# Patient Record
Sex: Female | Born: 1935 | Race: White | Hispanic: No
Health system: Midwestern US, Community
[De-identification: ages and names within clinical notes are randomized; demographics above are authoritative.]

## PROBLEM LIST (undated history)

## (undated) DIAGNOSIS — N3090 Cystitis, unspecified without hematuria: Secondary | ICD-10-CM

---

## 2018-07-10 ENCOUNTER — Ambulatory Visit: Attending: Family | Primary: Family

## 2018-07-10 ENCOUNTER — Ambulatory Visit: Admit: 2018-07-10 | Discharge: 2018-07-10 | Payer: MEDICARE | Attending: Family | Primary: Family

## 2018-07-10 DIAGNOSIS — E039 Hypothyroidism, unspecified: Secondary | ICD-10-CM

## 2018-07-10 NOTE — Progress Notes (Signed)
HISTORY OF PRESENT ILLNESS  Rebekah Snyder is a 84 y.o. female. New patient here to get established for care. Here with caregiver and her daughter in law.   Has advanced dementia Her husband who was her caregiver died 2 months ago. She has been brought to [place] to live with her son and his wife. They are paying for sitter during the day while they work.   Has had deterioration in her behavior . In the past has had a UTI with these changes. Is sleeping more . Has more irritability and is less cooperative. Is incontinent wears briefs. Will not allow anyone to help her bath and her hygiene has deteriorated.   Not able to cooperate with getting a urine specimen.   Is eating has a good appetite but does not drink very much.   Daughter in law wishes for her to have a flu shot    HPI    Review of Systems   Constitutional: Positive for malaise/fatigue. Negative for chills and fever.   HENT: Negative for congestion.    Respiratory: Negative for cough and shortness of breath.    Cardiovascular: Negative for chest pain and leg swelling.   Gastrointestinal: Negative for abdominal pain, constipation and diarrhea.   Genitourinary: Positive for frequency.        Incontinence   Musculoskeletal: Negative for back pain and myalgias.   Skin: Negative for rash.   Neurological: Negative for weakness and headaches.   Endo/Heme/Allergies: Negative for polydipsia.   Psychiatric/Behavioral: Positive for memory loss.       Physical Exam   Constitutional: She appears well-developed and well-nourished.   Elderly female in no acute distress Repeats same questions over and over.    HENT:   Head: Normocephalic.   Nose: Nose normal.   Mouth/Throat: No oropharyngeal exudate.   Eyes: Pupils are equal, round, and reactive to light.   Neck: Normal range of motion. Neck supple.   Cardiovascular: Normal rate, regular rhythm and normal heart sounds.   Pulmonary/Chest: Effort normal and breath sounds normal.    Abdominal: Soft. Bowel sounds are normal. There is no tenderness.   Musculoskeletal: Normal range of motion.   Walks without assistive devices   Neurological: She is alert.   States name birth date thinks husband is still alive. Unable to say where she is unable to name her caregiver or her daughter in law.    Skin: Skin is warm and dry.   Psychiatric:   Cooperative at times has to be coaxed to allow flu shot   Nursing note and vitals reviewed.    BP 132/74 (BP 1 Location: Right arm, BP Patient Position: Sitting)    Pulse 91    Temp 97.9 ??F (36.6 ??C) (Oral)    Wt 119 lb (54 kg)    SpO2 98%   ASSESSMENT and PLAN    ICD-10-CM ICD-9-CM    1. Acquired hypothyroidism E03.9 244.9 levothyroxine (SYNTHROID) 100 mcg tablet   2. Late onset Alzheimer's disease with behavioral disturbance (HCC) G30.1 331.0 memantine (NAMENDA) 10 mg tablet    F02.81 294.11 donepezil (ARICEPT) 10 mg tablet   3. Dysuria R30.0 788.1 CANCELED: AMB POC URINALYSIS DIP STICK MANUAL W/ MICRO   4. Encounter for immunization Z23 V03.89 PR IMMUNIZ ADMIN,1 SINGLE/COMB VAC/TOXOID      INFLUENZA VACCINE INACTIVATED (IIV), SUBUNIT, ADJUVANTED, IM   Given flu shot treated presumptively for UTI. If not improved let us know. Asked daughter in law to find out who has health  care POA and get [place] a copy. Her condition appears to be severe and they may have to consider a facility soon.over 30 min spent speaking with daughter in law about care taking issues

## 2018-07-10 NOTE — Progress Notes (Signed)
HISTORY OF PRESENT ILLNESS  Rebekah Snyder is a 84 y.o. female. New patient here to get established for care. Here with caregiver and her daughter in law.   Has advanced dementia Her husband who was her caregiver died 2 months ago. She has been brought to [place] to live with her son and his wife. They are paying for sitter during the day while they work.   Has had deterioration in her behavior . In the past has had a UTI with these changes. Is sleeping more . Has more irritability and is less cooperative. Is incontinent wears briefs. Will not allow anyone to help her bath and her hygiene has deteriorated.   Not able to cooperate with getting a urine specimen.   Is eating has a good appetite but does not drink very much.   Daughter in law wishes for her to have a flu shot    HPI    Review of Systems   Constitutional: Positive for malaise/fatigue. Negative for chills and fever.   HENT: Negative for congestion.    Respiratory: Negative for cough and shortness of breath.    Cardiovascular: Negative for chest pain and leg swelling.   Gastrointestinal: Negative for abdominal pain, constipation and diarrhea.   Genitourinary: Positive for frequency.        Incontinence   Musculoskeletal: Negative for back pain and myalgias.   Skin: Negative for rash.   Neurological: Negative for weakness and headaches.   Endo/Heme/Allergies: Negative for polydipsia.   Psychiatric/Behavioral: Positive for memory loss.       Physical Exam   Constitutional: She appears well-developed and well-nourished.   Elderly female in no acute distress Repeats same questions over and over.    HENT:   Head: Normocephalic.   Nose: Nose normal.   Mouth/Throat: No oropharyngeal exudate.   Eyes: Pupils are equal, round, and reactive to light.   Neck: Normal range of motion. Neck supple.   Cardiovascular: Normal rate, regular rhythm and normal heart sounds.   Pulmonary/Chest: Effort normal and breath sounds normal.   Abdominal: Soft. Bowel sounds are  normal. There is no tenderness.   Musculoskeletal: Normal range of motion.   Walks without assistive devices   Neurological: She is alert.   States name birth date thinks husband is still alive. Unable to say where she is unable to name her caregiver or her daughter in law.    Skin: Skin is warm and dry.   Psychiatric:   Cooperative at times has to be coaxed to allow flu shot   Nursing note and vitals reviewed.    BP 132/74 (BP 1 Location: Right arm, BP Patient Position: Sitting)    Pulse 91    Temp 97.9 ??F (36.6 ??C) (Oral)    Wt 119 lb (54 kg)    SpO2 98%   ASSESSMENT and PLAN    ICD-10-CM ICD-9-CM    1. Acquired hypothyroidism E03.9 244.9 levothyroxine (SYNTHROID) 100 mcg tablet   2. Late onset Alzheimer's disease with behavioral disturbance (HCC) G30.1 331.0 memantine (NAMENDA) 10 mg tablet    F02.81 294.11 donepezil (ARICEPT) 10 mg tablet   3. Dysuria R30.0 788.1 CANCELED: AMB POC URINALYSIS DIP STICK MANUAL W/ MICRO   4. Encounter for immunization Z23 V03.89 PR IMMUNIZ ADMIN,1 SINGLE/COMB VAC/TOXOID      INFLUENZA VACCINE INACTIVATED (IIV), SUBUNIT, ADJUVANTED, IM   Given flu shot treated presumptively for UTI. If not improved let us know. Asked daughter in law to find out who has health  care POA and get [place] a copy. Her condition appears to be severe and they may have to consider a facility soon.over 30 min spent speaking with daughter in law about care taking issues

## 2018-07-14 NOTE — Telephone Encounter (Signed)
Rebekah Snyder called back and said she checked at the pharmacy for the antibiotic to treat for a UTI. She said nothing was sent to the pharmacy and she wanted to know if you were going to send over something for 5 days. She said she has an odor and the way she is acting thinks she may have had it for awhile, she's not getting up at night like she was then.

## 2018-07-14 NOTE — Telephone Encounter (Signed)
Formatting of this note might be different from the original.  Rebekah Snyder called back and said she checked at the pharmacy for the antibiotic to treat for a UTI. She said nothing was sent to the pharmacy and she wanted to know if you were going to send over something for 5 days. She said she has an odor and the way she is acting thinks she may have had it for awhile, she's not getting up at night like she was then.   Electronically signed by Melina Schools N at 07/14/2018 10:54 AM EDT

## 2018-07-15 ENCOUNTER — Encounter

## 2018-07-15 MED ORDER — NITROFURANTOIN (25% MACROCRYSTAL FORM) 100 MG CAP
100 mg | ORAL_CAPSULE | Freq: Two times a day (BID) | ORAL | 0 refills | Status: AC
Start: 2018-07-15 — End: ?

## 2018-07-15 NOTE — Telephone Encounter (Signed)
Please call and tell them I sent it in this am

## 2018-07-15 NOTE — Telephone Encounter (Signed)
Formatting of this note might be different from the original.  Please call and tell them I sent it in this am  Electronically signed by Lyn Records, NP at 07/15/2018  7:23 AM EDT

## 2019-02-16 IMAGING — CR XR CHEST 1 VIEW
1 series · 1 of 1 positions shown · non-contrast
Comparison: none

AMS WEAKNESS
SINGLE PORTABLE CHEST:
CLINICAL INDICATION: Syncope, recurrent
REFERENCE: None.

[AP]
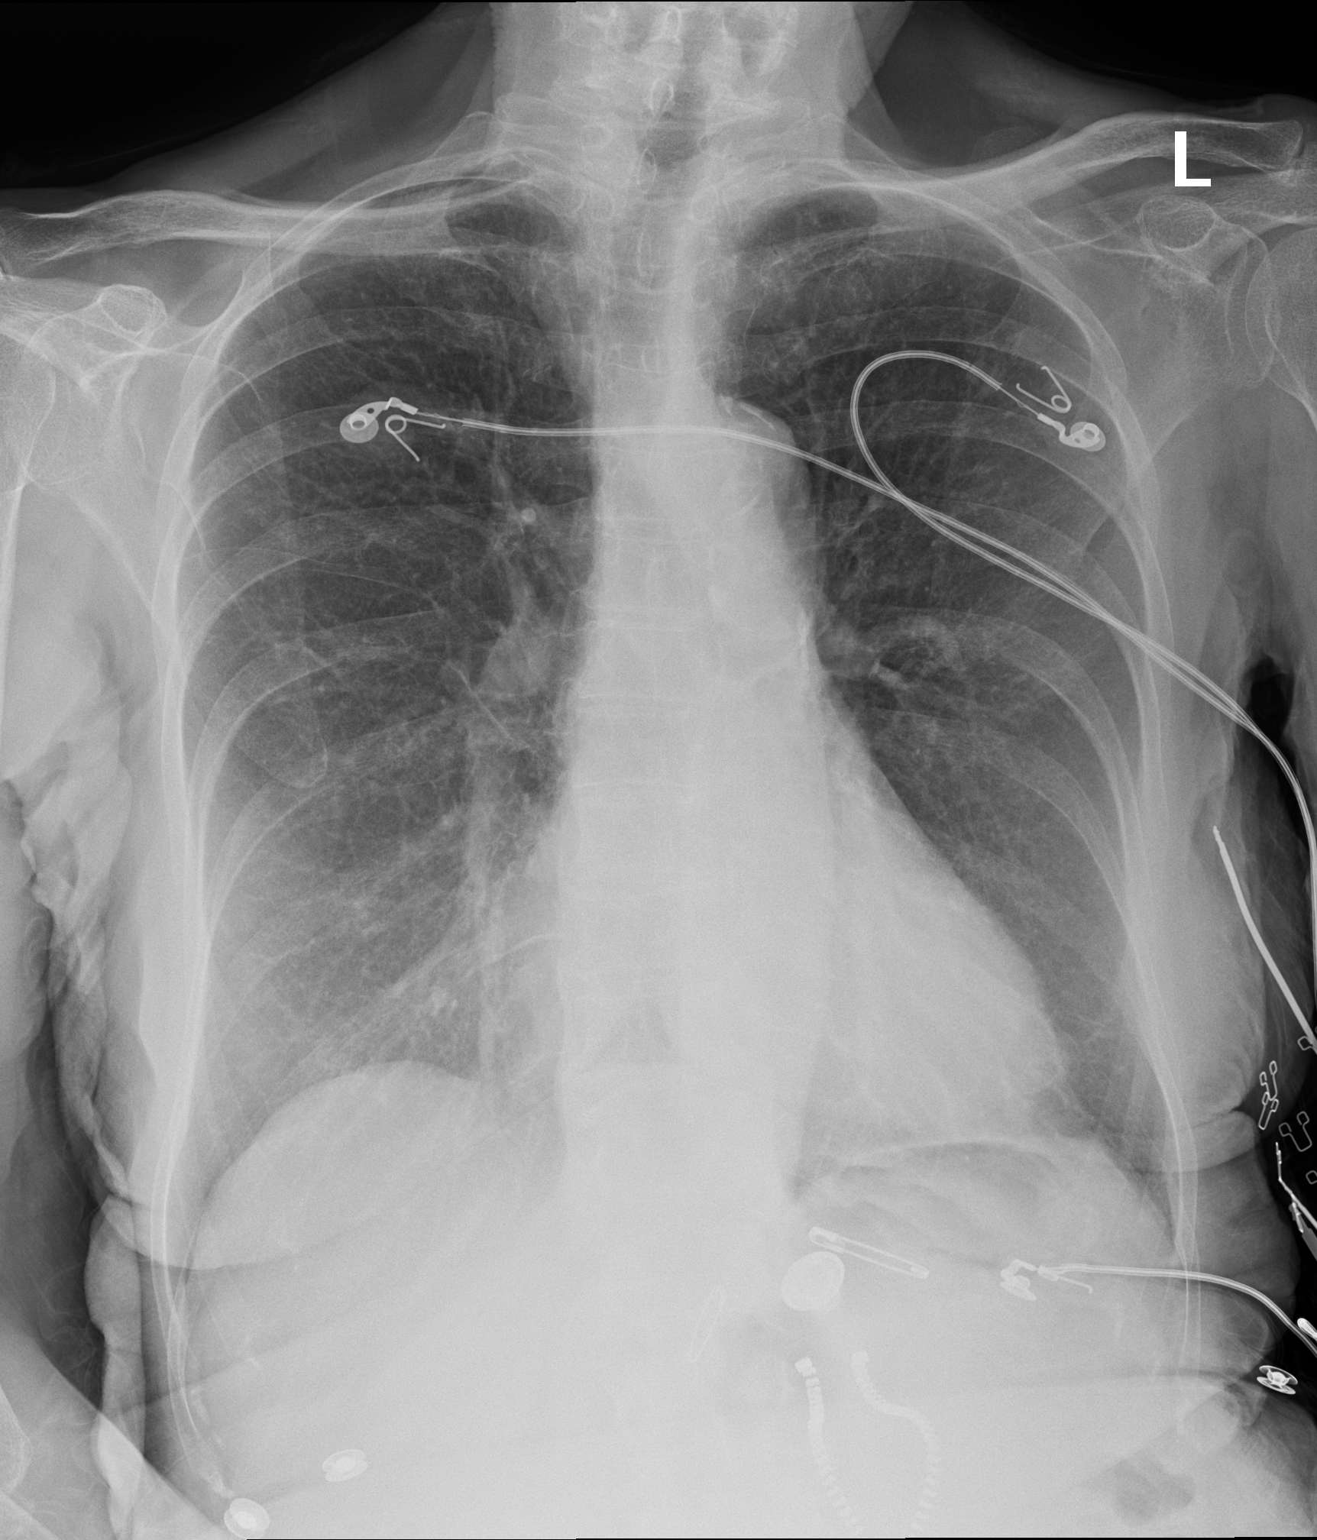

[1 of 1 positions shown; findings below may reference images not displayed]

FINDINGS: Single radiograph of the chest demonstrates normal cardiomediastinal contours.
The lungs demonstrate chronic appearing interstitial changes. No focal consolidation or effusion.
Surrounding osseous and soft tissue structures demonstrate no acute abnormality.
IMPRESSION: No acute cardiopulmonary process identified.
LOCATION CODE: 1

## 2023-09-10 IMAGING — CR XR CHEST 1 VIEW
1 series · 2 of 2 positions shown · non-contrast
Comparison: 02/16/2019.

FINAL REPORT:
Chest portable one view
INDICATION: stroke protocol. Shortness of breath.

[Series 7792: AP · 2 of 2 slices shown]
[im 1/2]
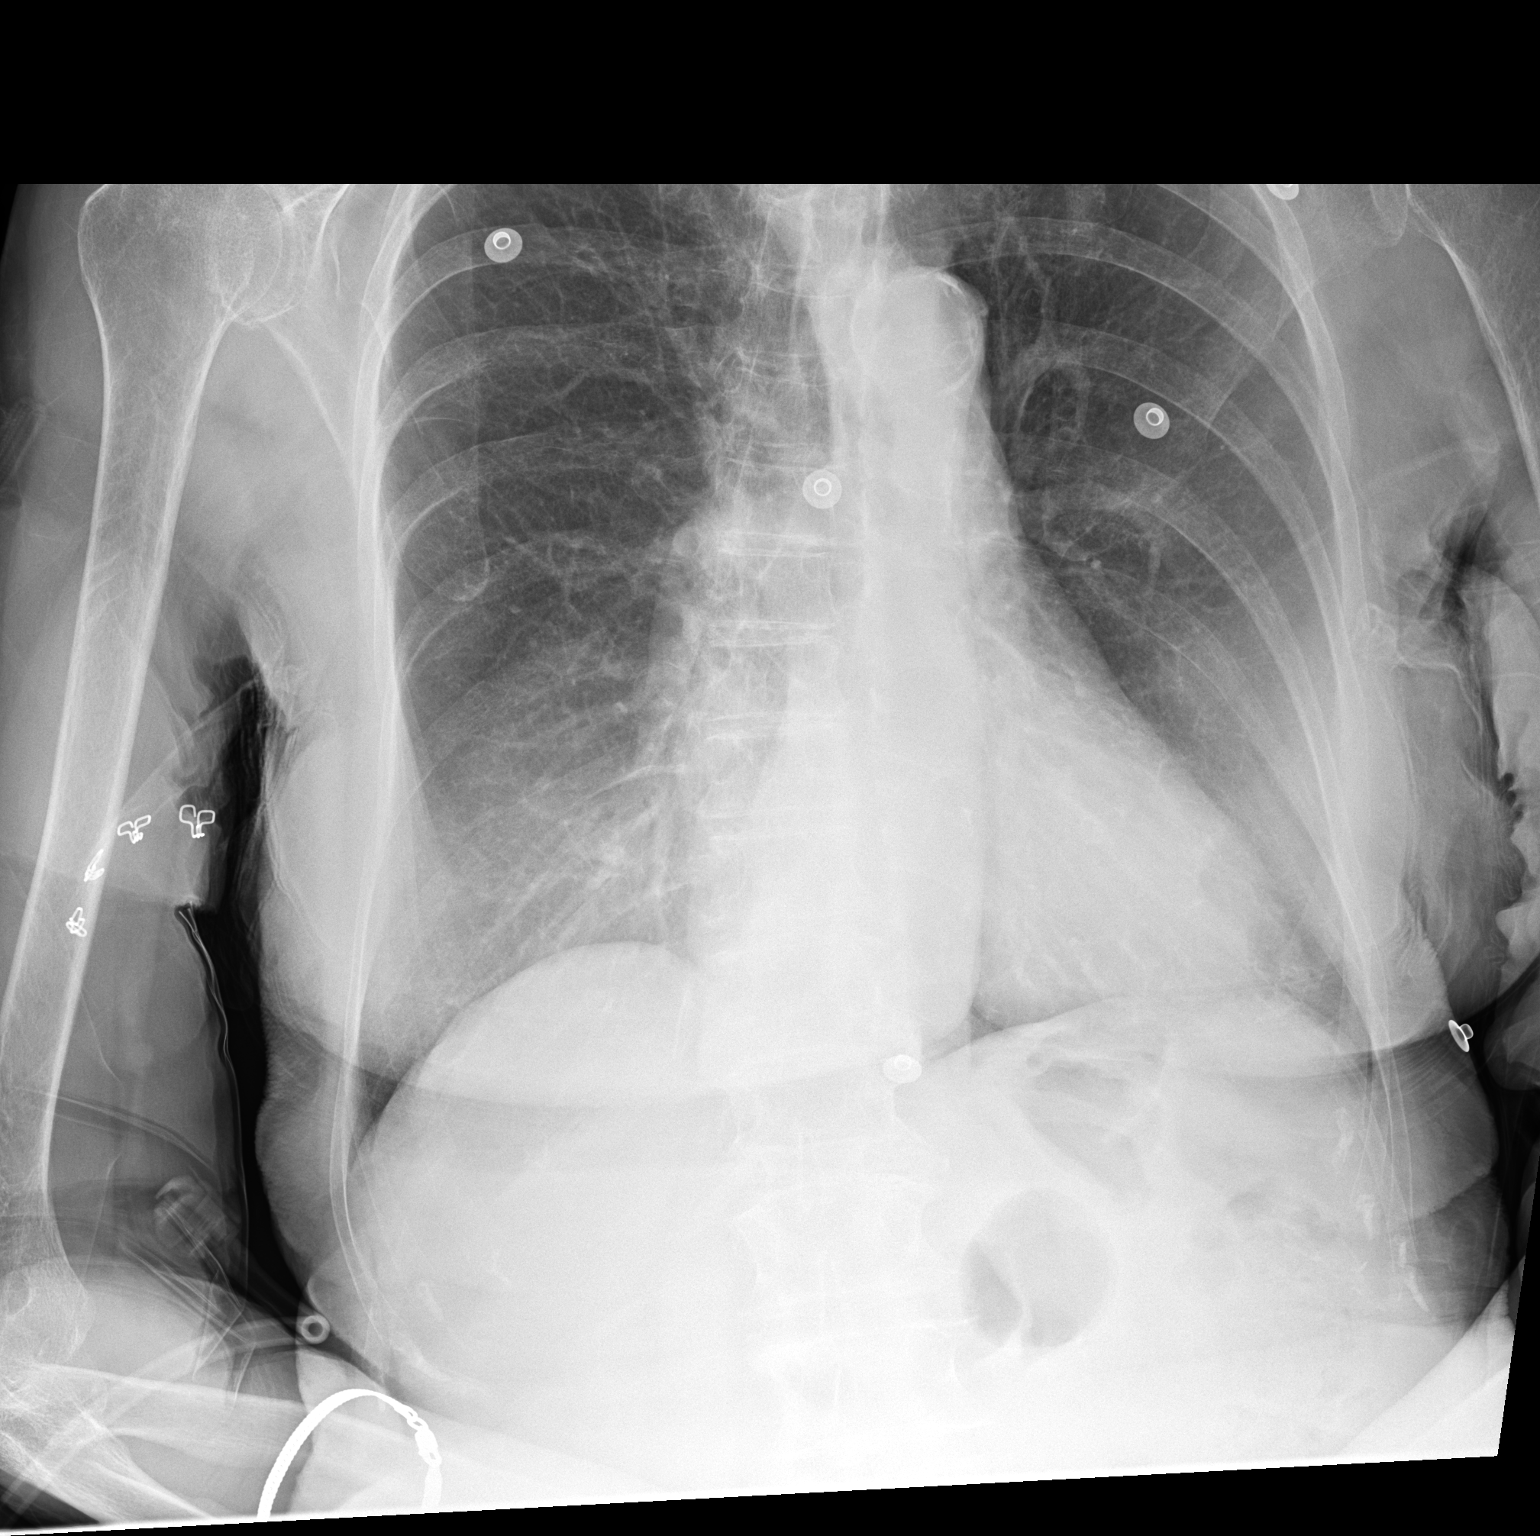
[im 2/2]
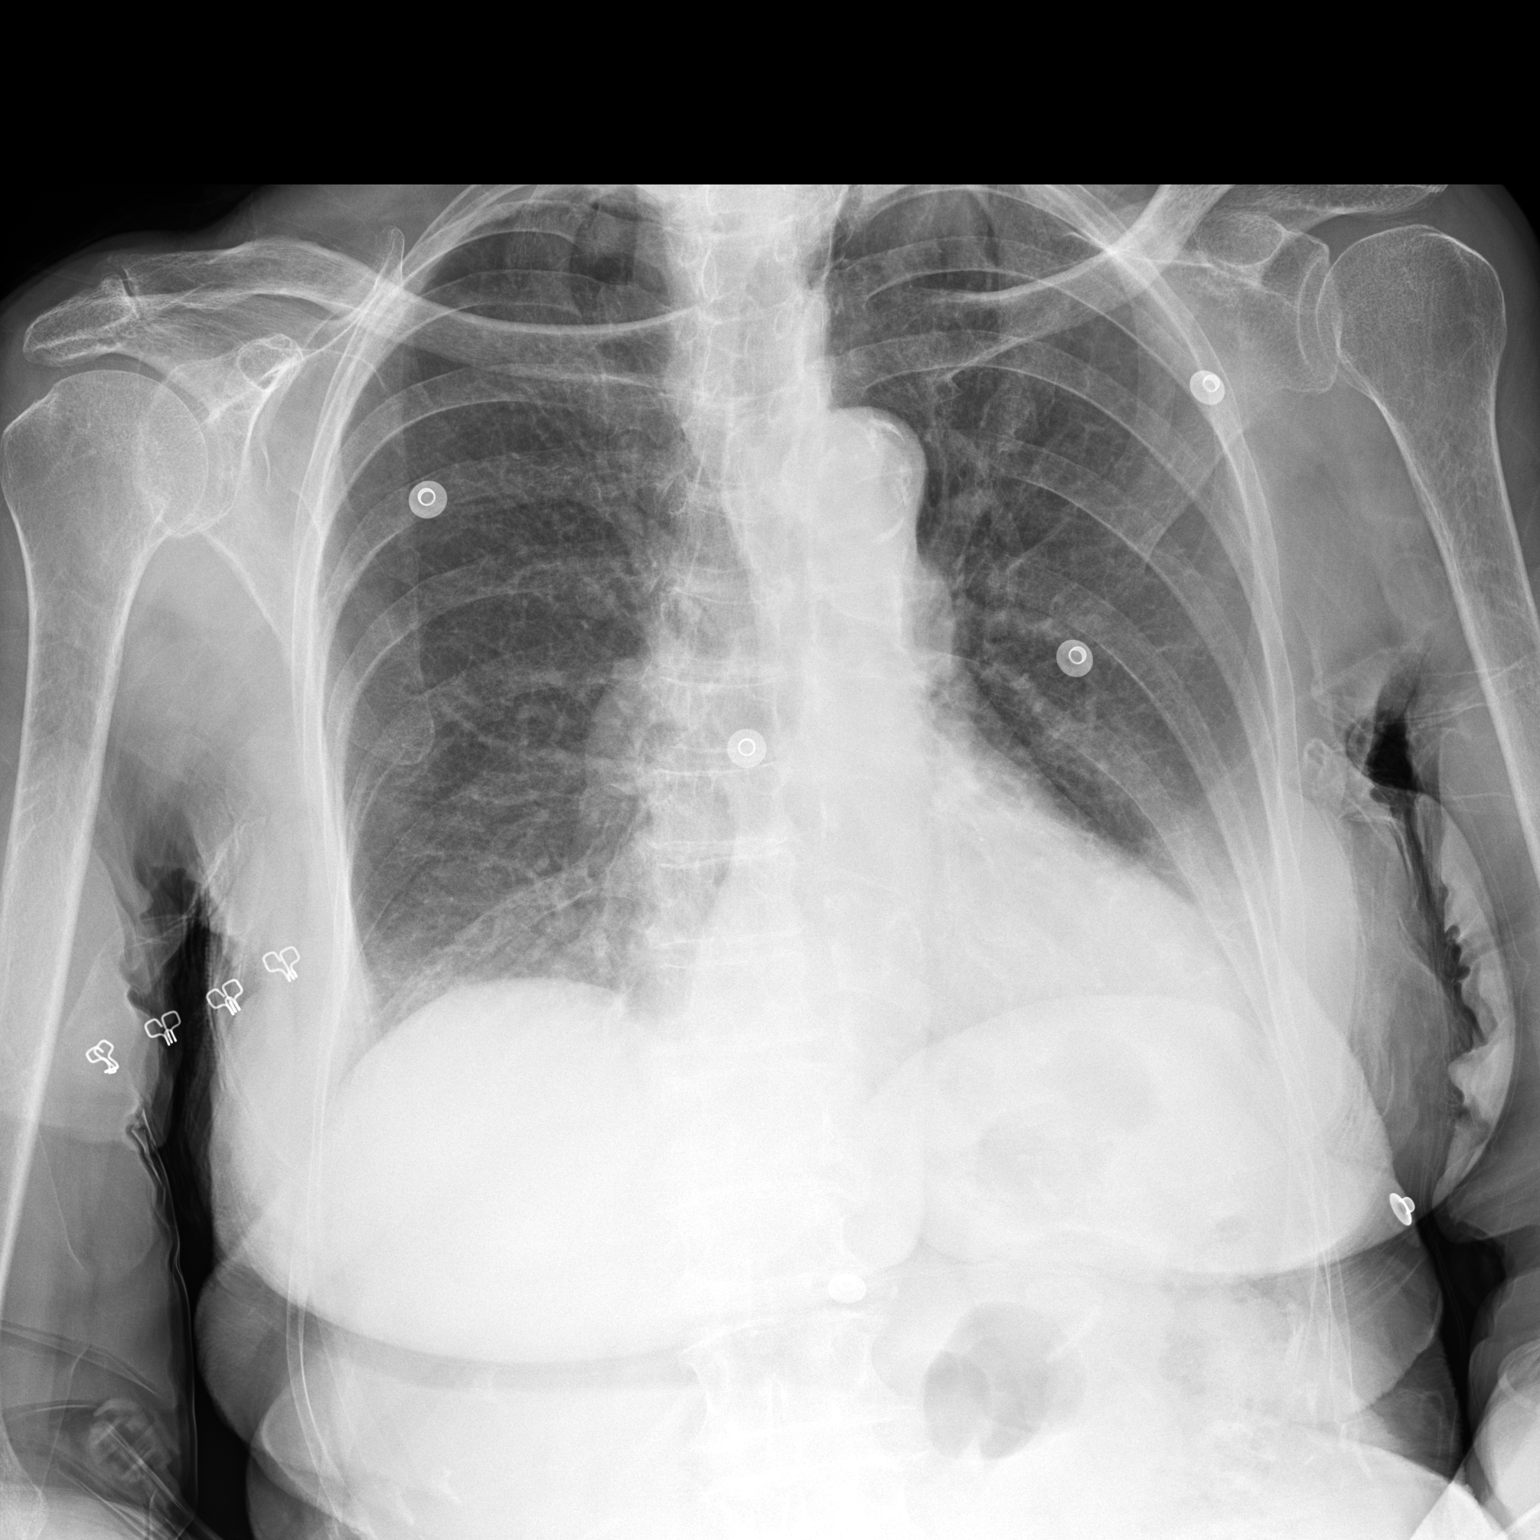

[2 of 2 positions shown; findings below may reference images not displayed]

FINDINGS: The cardiac silhouette is within normal limits given portable technique.
No focal consolidation. No pneumothorax. No sizeable pleural effusion.
No acute osseous abnormalities.
IMPRESSION: 
IMPRESSION: No acute cardiopulmonary process.
Portable?
Yes

## 2023-09-10 IMAGING — CT CT HEAD WITHOUT CONTRAST
3 of 4 series · 16 of 47 positions shown, 19 images · IV contrast (agent unspecified)
Comparison: None
Axial spiral CT acquisition was performed from the cranial vertex through the foramen magnum.

Weakness, Neuro deficit, acute, stroke suspected, Left hemiparesis, suspected right MCA territory subacute stroke
FINAL REPORT:
CT head without contrast:
Clinical indication: Neuro deficit, acute, stroke suspected
Left hemiparesis, suspected right MCA territory subacute stroke

[Series 2: brain without · axial · non-contrast · 0.43mm/px · z∈[+1110,+1250]mm · 10 of 34 slices shown, 13 images]
[im 3/34  brain]
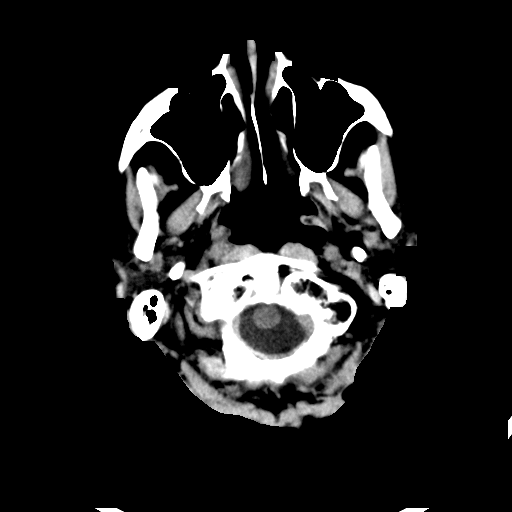
[im 3/34  bone]
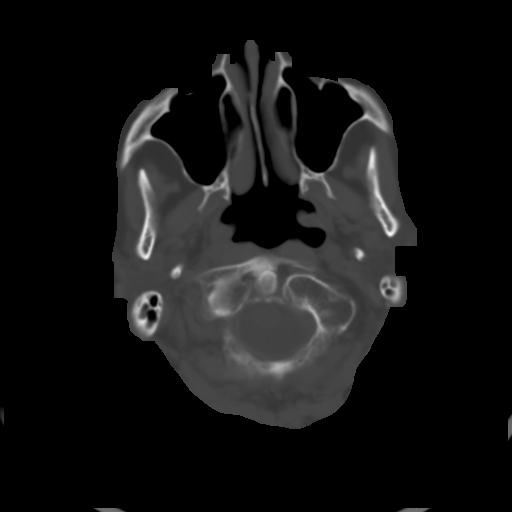
[im 5/34  brain]
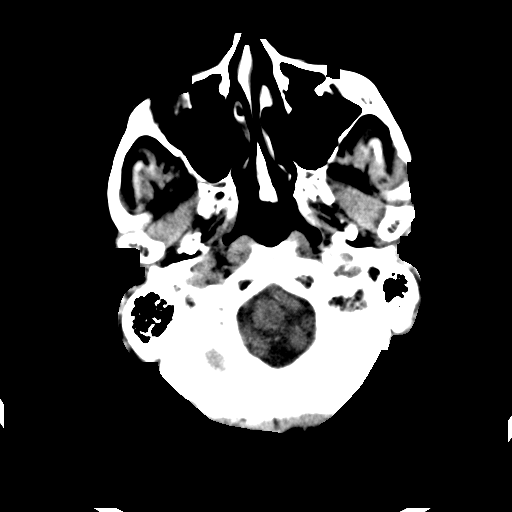
[im 10/34  brain]
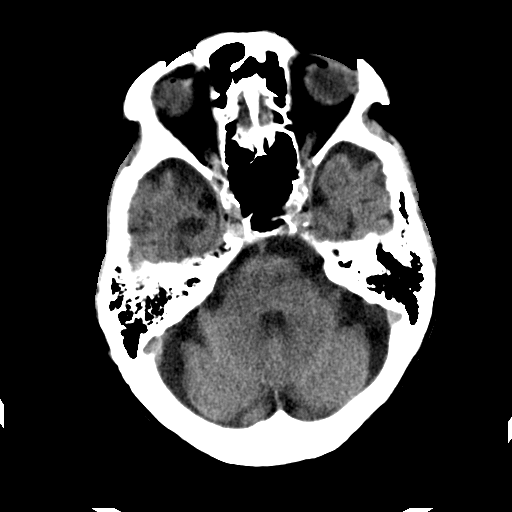
[im 12/34  brain]
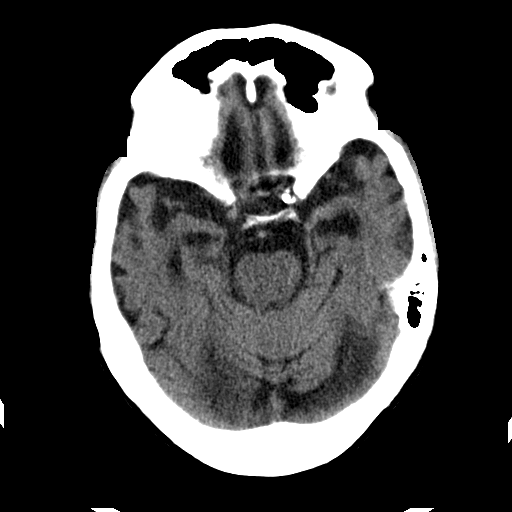
[im 15/34  brain]
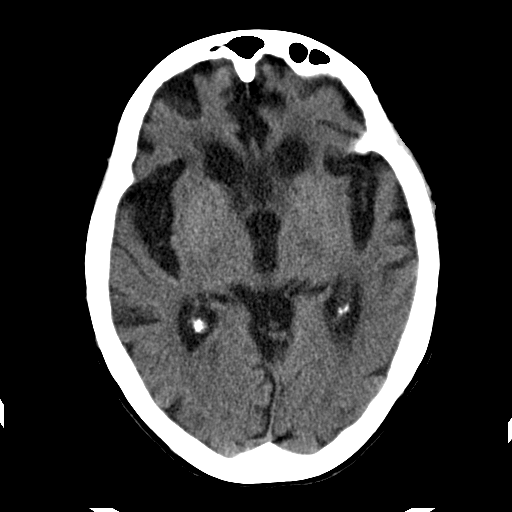
[im 15/34  bone]
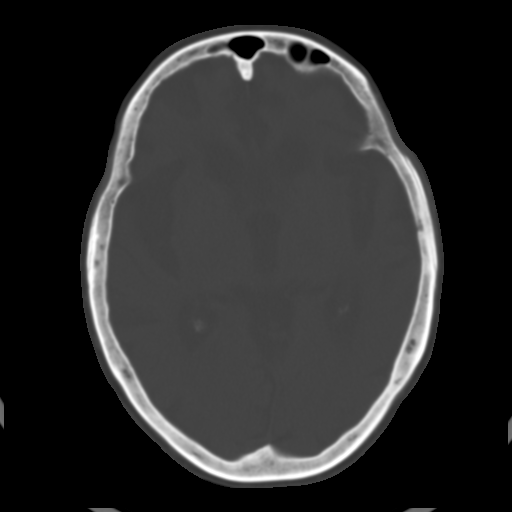
[im 19/34  brain]
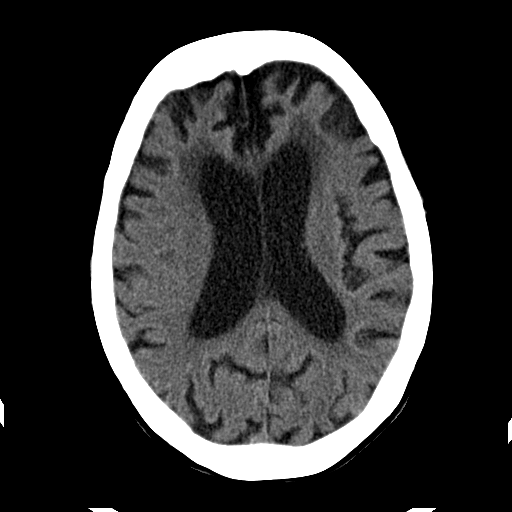
[im 22/34  brain]
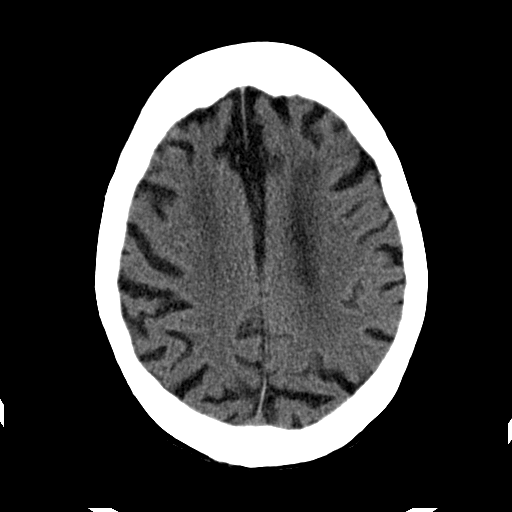
[im 24/34  brain]
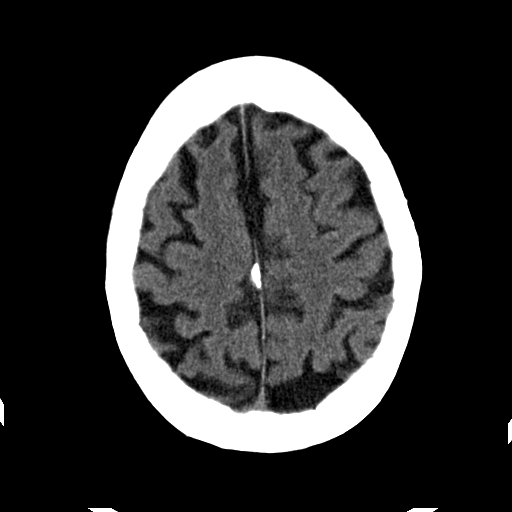
[im 29/34  brain]
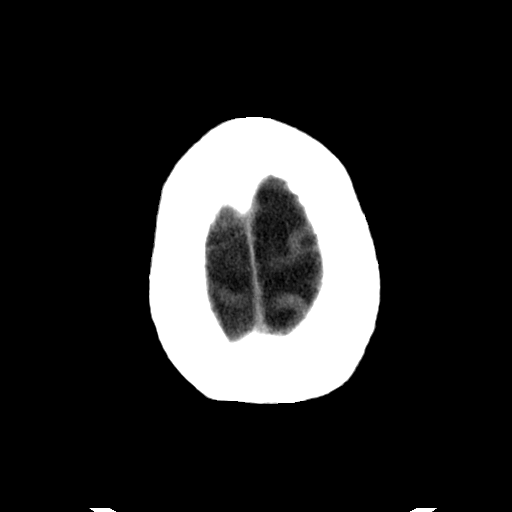
[im 29/34  bone]
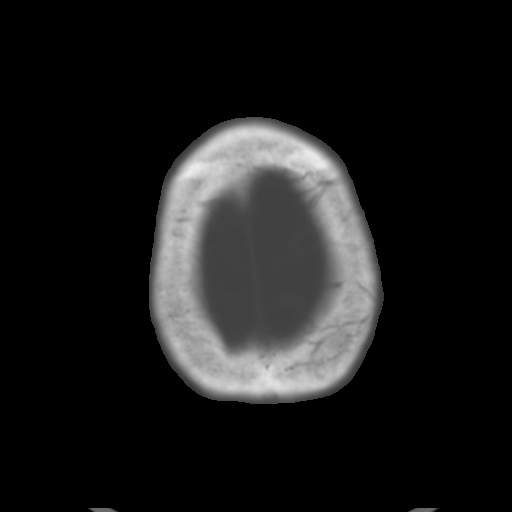
[im 31/34  brain]
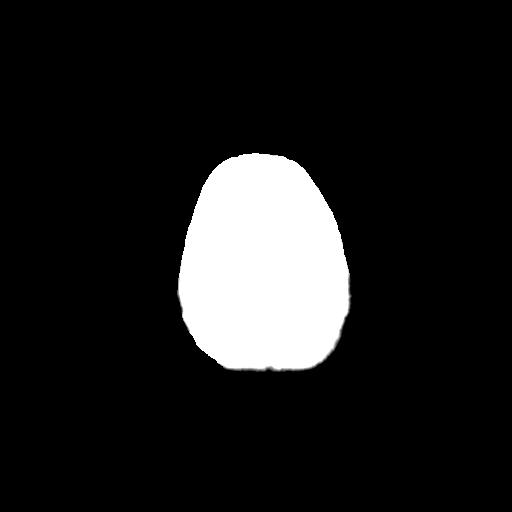

[mpr, brain thin, coronal · coronal · 0.42mm/px · 3 of 105 slices shown]
[im 35/105  brain]
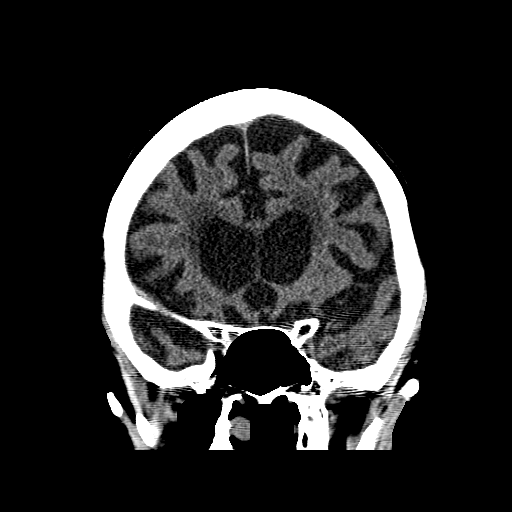
[im 47/105  brain]
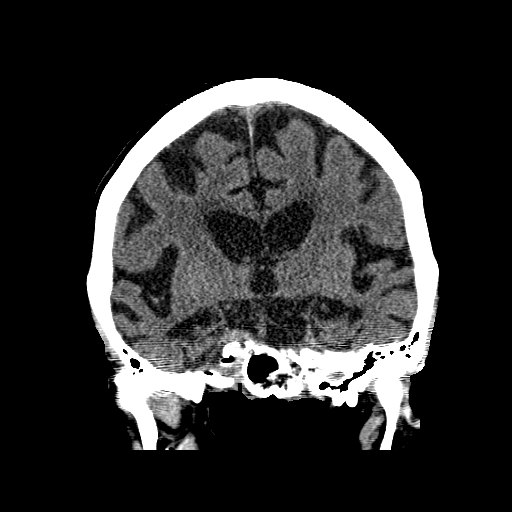
[im 58/105  brain]
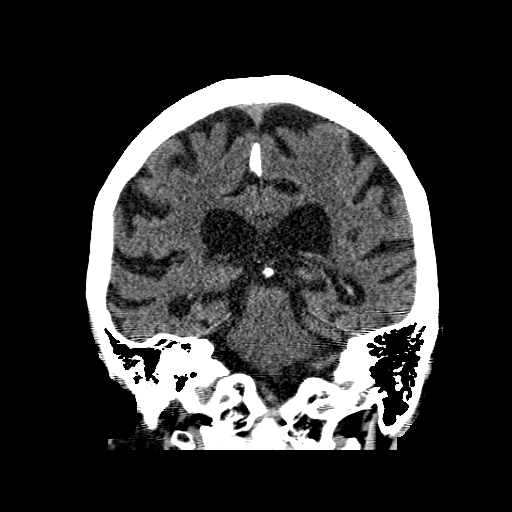

[mpr, brain thin, sagittal · sagittal · 0.42mm/px · 3 of 109 slices shown]
[im 37/109  brain]
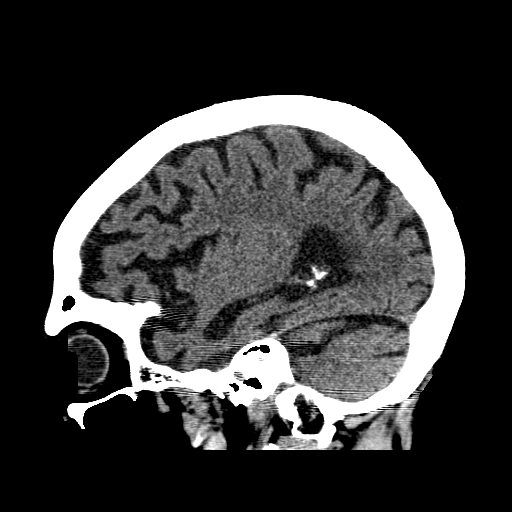
[im 55/109  brain]
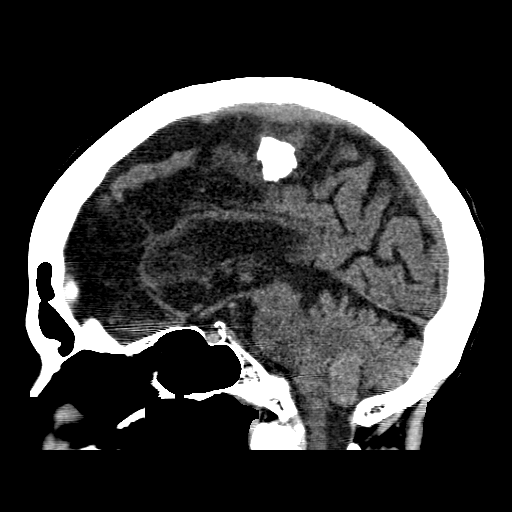
[im 73/109  brain]
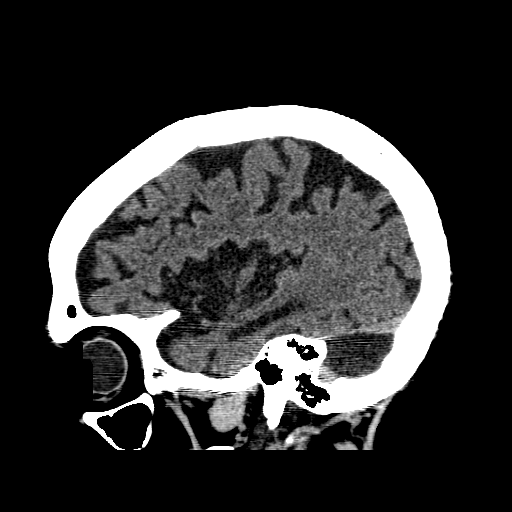

[16 of 47 positions shown; findings below may reference images not displayed]

All CT scans at this facility use iterative reconstruction technique, dose modulation and/or weight based dosing when appropriate to reduce radiation dose to as low as reasonably achievable.
The ventricles and sulci are prominent. There is no acute intracranial hemorrhage or mass effect. The gray-white differentiation is preserved. There is patchy cerebral white matter lucency. The cerebellum and brainstem are unremarkable in appearance. The skeletal structures appear intact.
IMPRESSION: 
IMPRESSION: Moderate generalized atrophy.
Mild cerebral white matter chronic microangiopathy.
Both are greatest in the frontal lobes.
No acute intracranial process.

## 2023-09-10 IMAGING — MR MRI BRAIN WITHOUT CONTRAST
17 series · 48 of 48 positions shown · non-contrast
Comparison: CT 09/10/2023

Patient altered and combative, motion degraded sequences used. Best images possible.
FINAL REPORT:
MRI BRAIN WITHOUT CONTRAST
INDICATION: Stroke, follow up
TECHNIQUE: Multiplanar multisequence magnetic resonance imaging of the brain was performed without contrast.

[Series 1: survey · sagittal · 1.6mm · 1.62mm/px · 12 of 128 slices shown]
[im 1/128]
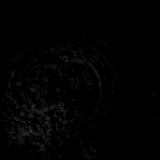
[im 12/128]
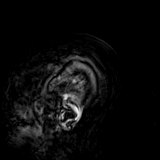
[im 24/128]
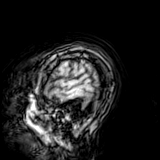
[im 35/128]
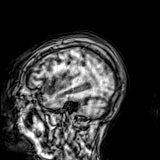
[im 47/128]
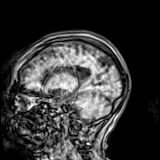
[im 58/128]
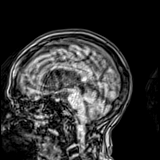
[im 70/128]
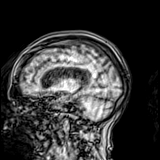
[im 81/128]
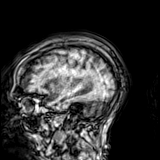
[im 93/128]
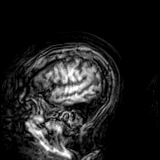
[im 104/128]
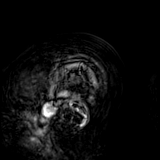
[im 116/128]
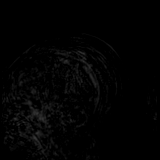
[im 128/128]
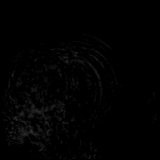

[Series 2: survey_mpr_sag · oblique · 1.6mm · 1.60mm/px · 1 of 5 slices shown]
[im 1/5]
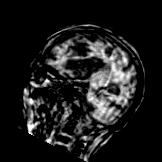

[Series 3: survey_mpr_cor · coronal · 1.6mm · 1.60mm/px · 1 of 3 slices shown]
[im 1/3]
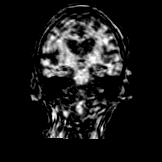

[Series 4: survey_mpr_tra · axial · 1.6mm · 1.60mm/px · 1 of 3 slices shown]
[im 1/3]
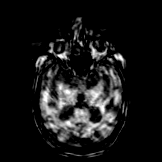

[Series 5: ax dwi_tracew · axial · 5.0mm · 0.87mm/px · z∈[-105,+49]mm · 2 of 27 slices shown (1 of 2)]
[im 1/27]
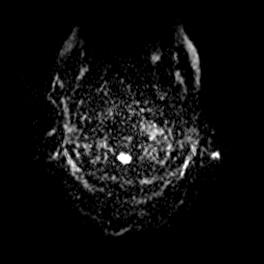
[im 27/27]
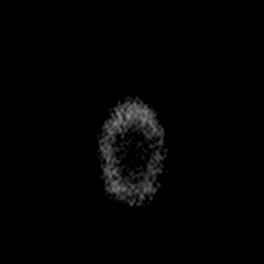

[Series 6: ax dwi_adc · axial · 5.0mm · 0.87mm/px · z∈[-105,+49]mm · 2 of 27 slices shown (1 of 2)]
[im 1/27]
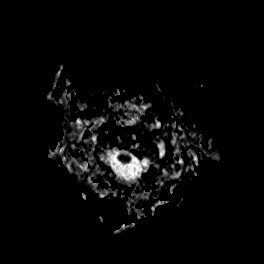
[im 27/27]
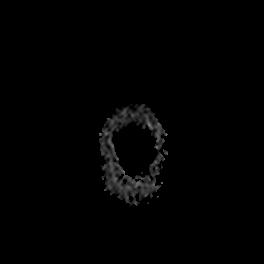

[Series 7: ax dwi_exp · axial · 5.0mm · 0.87mm/px · z∈[-105,+49]mm · 2 of 27 slices shown (1 of 2)]
[im 1/27]
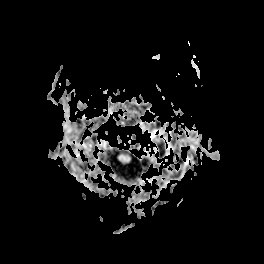
[im 27/27]
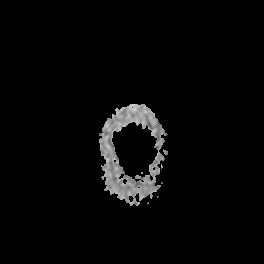

[Series 8: FLAIR · axial · 5.0mm · 0.90mm/px · z∈[-93,+62]mm · 2 of 27 slices shown]
[im 1/27]
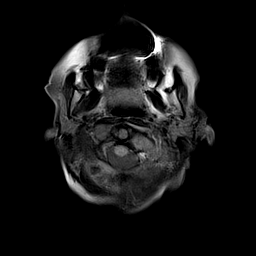
[im 27/27]
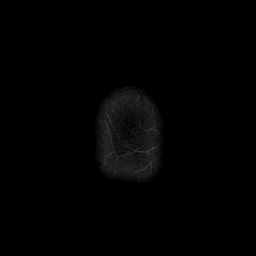

[Series 9: T2 fat-sat · axial · 5.0mm · 0.72mm/px · z∈[-93,+62]mm · 3 of 27 slices shown]
[im 1/27]
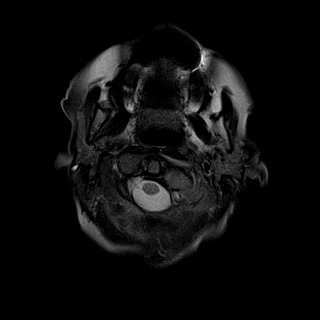
[im 14/27]
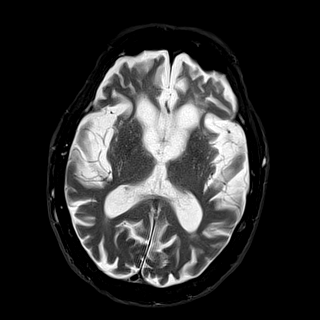
[im 27/27]
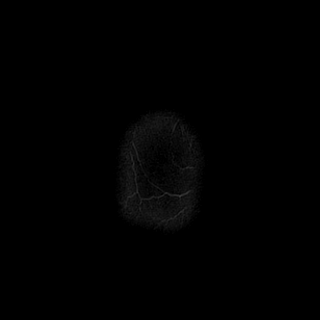

[Series 10: GRE · axial · 5.0mm · 1.20mm/px · z∈[-92,+64]mm · 3 of 27 slices shown (1 of 2)]
[im 1/27]
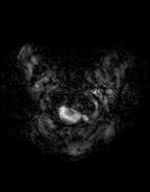
[im 14/27]
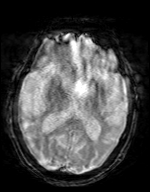
[im 27/27]
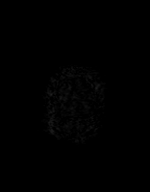

[Series 11: T1 · axial · 5.0mm · 0.90mm/px · z∈[-93,+62]mm · 3 of 27 slices shown (1 of 3)]
[im 1/27]
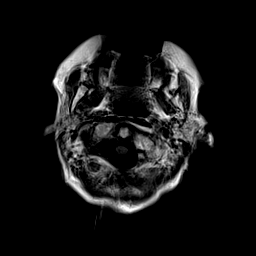
[im 14/27]
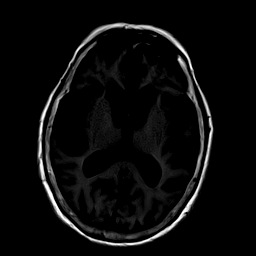
[im 27/27]
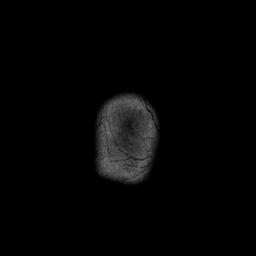

[Series 12: T1 · sagittal · 5.0mm · 0.90mm/px · 2 of 25 slices shown (2 of 3)]
[im 1/25]
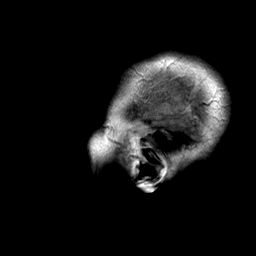
[im 25/25]
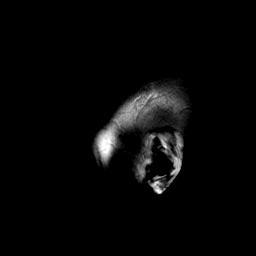

[Series 13: ax dwi_tracew · axial · 5.0mm · 0.60mm/px · z∈[-103,+50]mm · 3 of 27 slices shown (2 of 2)]
[im 1/27]
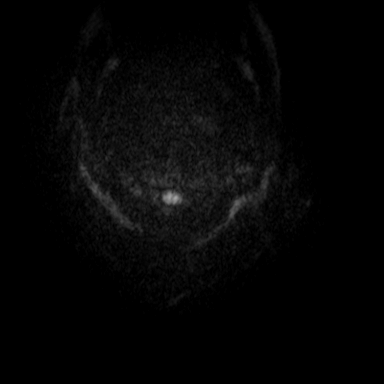
[im 14/27]
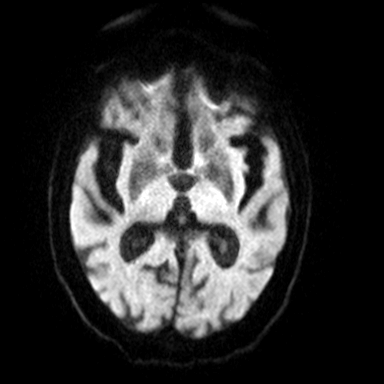
[im 27/27]
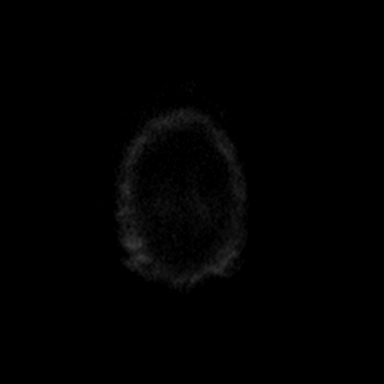

[Series 14: ax dwi_adc · axial · 5.0mm · 0.60mm/px · z∈[-103,+50]mm · 3 of 27 slices shown (2 of 2)]
[im 1/27]
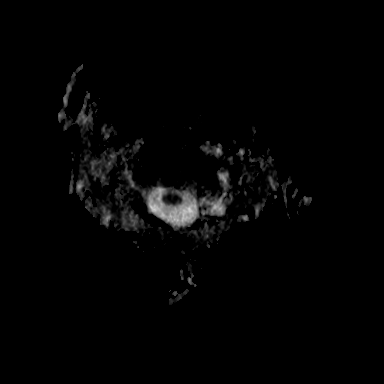
[im 14/27]
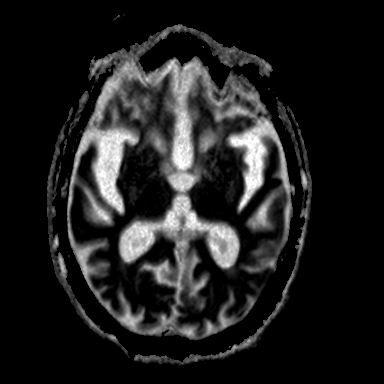
[im 27/27]
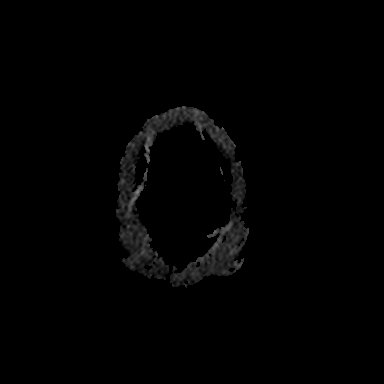

[Series 15: ax dwi_exp · axial · 5.0mm · 0.60mm/px · z∈[-103,+50]mm · 3 of 27 slices shown (2 of 2)]
[im 1/27]
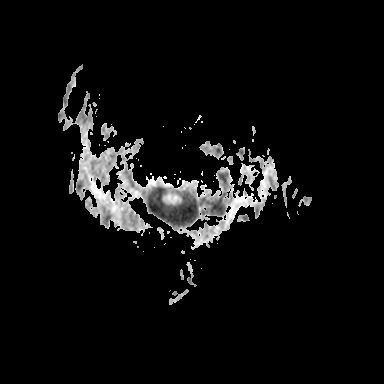
[im 14/27]
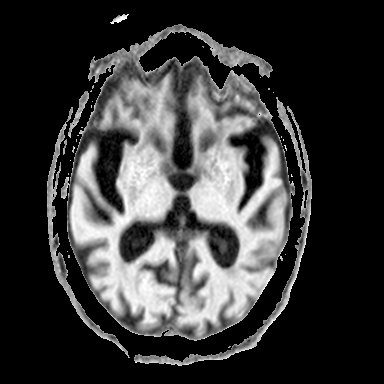
[im 27/27]
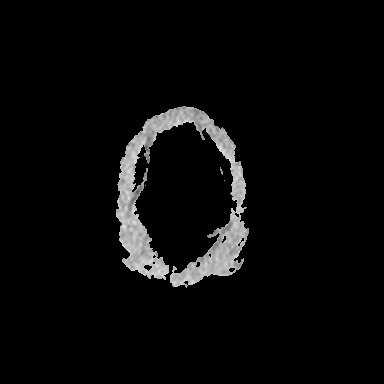

[Series 16: T1 · sagittal · 5.0mm · 0.90mm/px · 2 of 25 slices shown (3 of 3)]
[im 1/25]
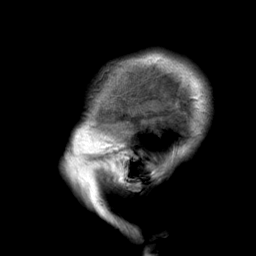
[im 25/25]
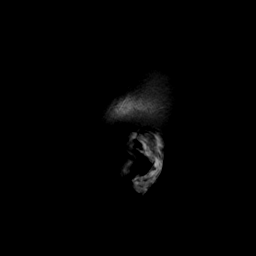

[Series 17: GRE · axial · 5.0mm · 1.20mm/px · z∈[-92,+64]mm · 3 of 27 slices shown (2 of 2)]
[im 1/27]
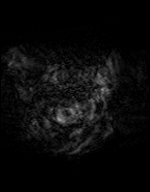
[im 14/27]
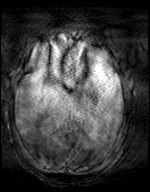
[im 27/27]
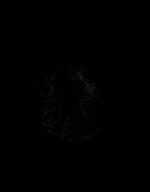

[48 of 48 positions shown; findings below may reference images not displayed]

FINDINGS: Parenchyma: Small acute infarction of the right hemipons. No acute hemorrhage. No mass or mass effect. Patchy and confluent areas of T2 hyperintensity are present in the cerebral white matter that are nonspecific but compatible with moderate chronic microvascular ischemic changes. Diffuse parenchymal volume loss without lobar predilection.
Extra-axial Collection: None
Ventricular System: Ex vacuo enlargement without hydrocephalus.
Major Intracranial Flow Voids: Flow voids are preserved.
Osseous Structures:  Expected marrow signal.
Included Orbits: Unremarkable
Paranasal Sinuses:  Predominantly clear
Tympanomastoid Cavities:  Unremarkable
IMPRESSION: 1.  Small acute infarction of the right hemipons. No acute acute hemorrhage.
2.  Moderate chronic microvascular ischemic changes of the brain.

## 2023-09-10 IMAGING — CT CT NECK ANGIO WITH AND WITHOUT IV CONTRAST
2 of 12 series · 7 of 33 positions shown · non-contrast
Comparison: None

TIA current     hx of cva  and dementia
Addendum:
[email or web address]
\F\[HOSPITAL]\F\
Communicated to: Dr. Kimhung
On behalf of: Dr. Dogkan Kymc
By: [HOSPITAL] [place]
At: [DATE]
On: 09/10/2023 EST
\F\/[HOSPITAL]\F\#)
FINAL REPORT:
CT NECK ANGIO WITH AND WITHOUT IV CONTRAST, CT HEAD ANGIO WITH AND WITHOUT IV CONTRAST
INDICATION: Transient ischemic attack (TIA)
TECHNIQUE: CTA of the head and neck was performed after the intravenous injection of contrast. Coronal and sagittal 3-D MIP imaging was performed per protocol.
All CT scans at this facility use dose modulation and/or weight based dosing when appropriate to reduce radiation dose to as low as reasonably achievable.

[Series 2: cta thins · axial · 0.55mm/px · z∈[-154,+67]mm · 4 of 591 slices shown]
[im 119/591  soft-tissue]
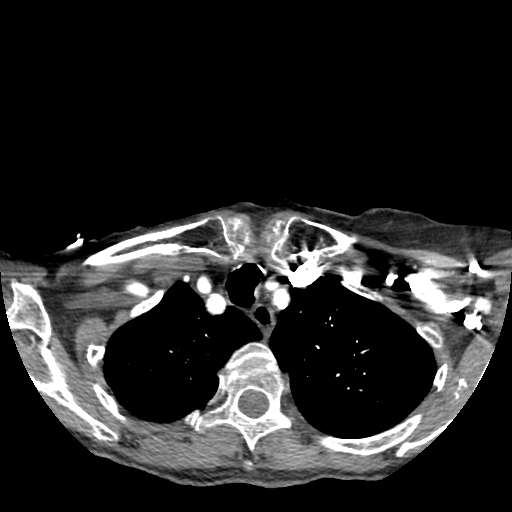
[im 237/591  bone]
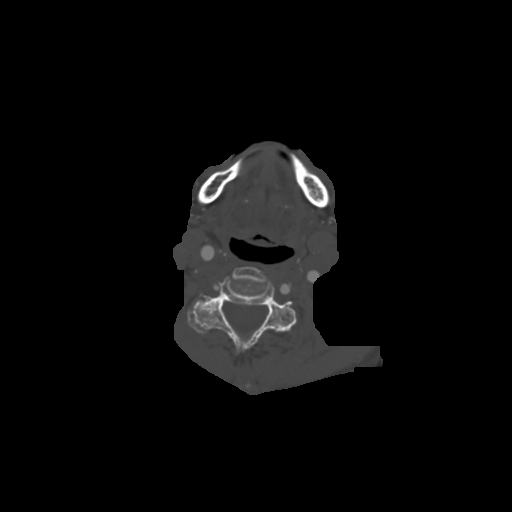
[im 355/591  soft-tissue]
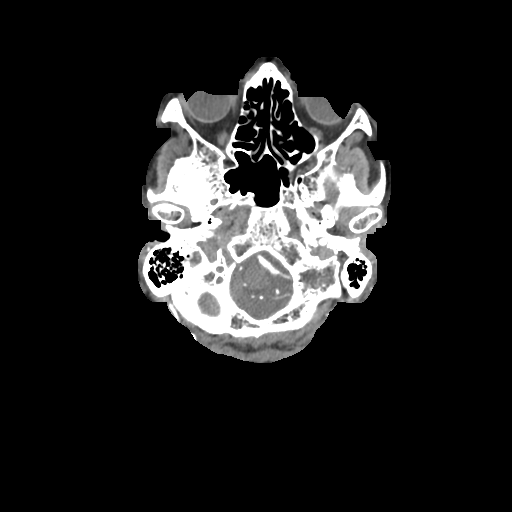
[im 473/591  bone]
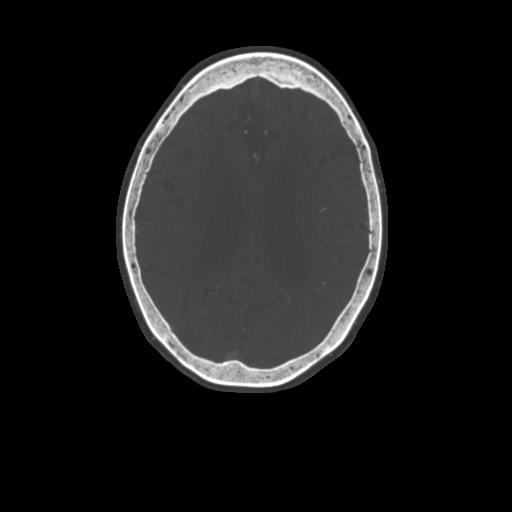

[Series 7: cta neck thin stnd · axial · 0.55mm/px · z∈[-166,-43]mm · 3 of 393 slices shown]
[im 99/393  soft-tissue]
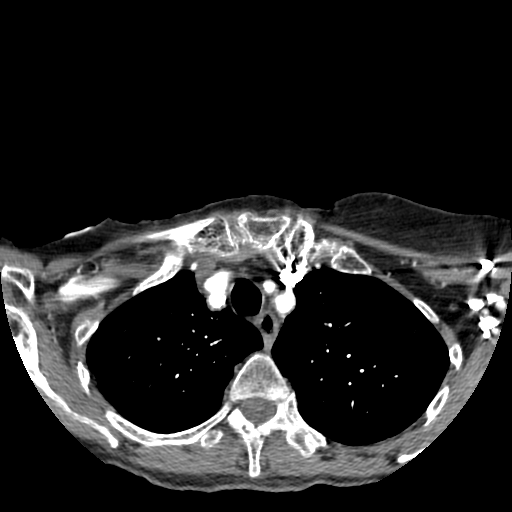
[im 197/393  soft-tissue]
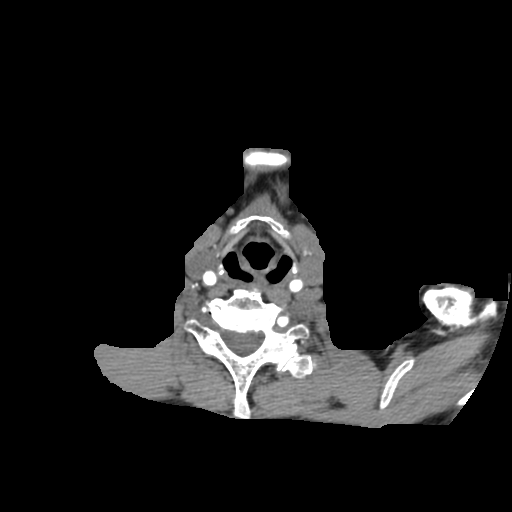
[im 295/393  soft-tissue]
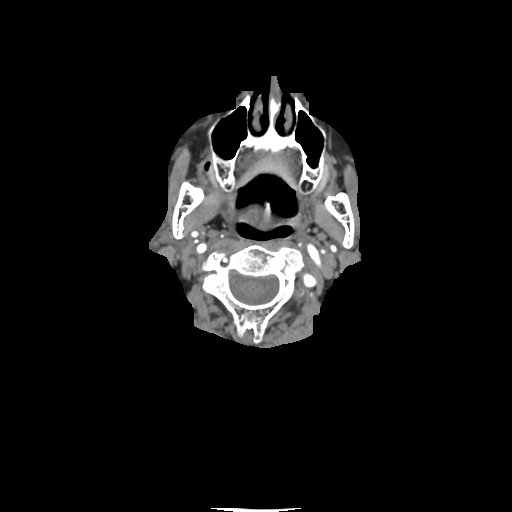

[7 of 33 positions shown; findings below may reference images not displayed]

FINDINGS: CTA HEAD:
Anterior Circulation:
Right intracranial internal carotid artery (ICA): Atherosclerotic changes without significant narrowing.
Right anterior cerebral artery (ACA): Atherosclerotic changes with multifocal mild narrowing.
Right middle cerebral artery (MCA): Atherosclerotic changes with severe stenosis/focal occlusion of the mid M1 segment (series 2, image 401)
Left intracranial internal carotid artery (ICA): Atherosclerotic changes with severe narrowing of the ICA terminus.
Left anterior cerebral artery (ACA): Atherosclerotic changes with multifocal mild narrowing.
Left middle cerebral artery (MCA): Patent
Posterior Circulation:
Right posterior cerebral artery (PCA): Atherosclerotic changes with multifocal mild to moderate narrowing
Left posterior cerebral artery (PCA): Atherosclerotic changes with severe narrowing of the P1/P2 junction.
Right vertebral artery (VA): Terminates as PICA
Left vertebral artery (VA): Patent
Basilar artery (BA): Atherosclerotic changes with moderate narrowing of the mid and distal basilar artery.
Other: Unremarkable
Dural Venous Sinuses: Unremarkable
CTA NECK:
Aortic arch and proximal great vessels: Atherosclerotic changes without significant narrowing.
Right carotid arterial system: Mild (<50%) internal carotid artery narrowing.
Left carotid arterial system: Mild (<50%) internal carotid artery narrowing.
Right vertebral artery: Diffusely diminutive caliber, likely congenitally nondominant.
Left vertebral artery: Mild narrowing at the origin.
Neck Soft Tissues: Unremarkable
Osseous Structures: Multilevel degenerative changes of the imaged spine.
Included Lung Apices: Unremarkable
IMPRESSION: :
1.  Severe stenosis/focal occlusion of the right mid M1 MCA.
2.  Scattered intracranial atherosclerotic disease with multifocal narrowing as described above.
